# Patient Record
Sex: Female | Born: 1940 | Race: Black or African American | Hispanic: No | Smoking: Never smoker
Health system: Southern US, Community
[De-identification: ages and names within clinical notes are randomized; demographics above are authoritative.]

## PROBLEM LIST (undated history)

## (undated) DIAGNOSIS — I10 Essential (primary) hypertension: Secondary | ICD-10-CM

---

## 2015-04-23 DIAGNOSIS — I10 Essential (primary) hypertension: Secondary | ICD-10-CM | POA: Insufficient documentation

## 2015-06-25 DIAGNOSIS — Z559 Problems related to education and literacy, unspecified: Secondary | ICD-10-CM | POA: Insufficient documentation

## 2015-06-25 DIAGNOSIS — Z91148 Patient's other noncompliance with medication regimen for other reason: Secondary | ICD-10-CM | POA: Insufficient documentation

## 2015-06-25 DIAGNOSIS — E559 Vitamin D deficiency, unspecified: Secondary | ICD-10-CM | POA: Insufficient documentation

## 2019-09-09 DIAGNOSIS — E66813 Obesity, class 3: Secondary | ICD-10-CM | POA: Insufficient documentation

## 2019-09-09 DIAGNOSIS — R4189 Other symptoms and signs involving cognitive functions and awareness: Secondary | ICD-10-CM | POA: Insufficient documentation

## 2019-09-09 DIAGNOSIS — Z8616 Personal history of COVID-19: Secondary | ICD-10-CM | POA: Insufficient documentation

## 2019-09-11 DIAGNOSIS — R3129 Other microscopic hematuria: Secondary | ICD-10-CM | POA: Insufficient documentation

## 2019-09-23 DIAGNOSIS — E119 Type 2 diabetes mellitus without complications: Secondary | ICD-10-CM | POA: Insufficient documentation

## 2020-01-02 DIAGNOSIS — E782 Mixed hyperlipidemia: Secondary | ICD-10-CM | POA: Insufficient documentation

## 2020-02-23 DIAGNOSIS — N1831 Chronic kidney disease, stage 3a: Secondary | ICD-10-CM | POA: Insufficient documentation

## 2021-02-26 DIAGNOSIS — H268 Other specified cataract: Secondary | ICD-10-CM | POA: Insufficient documentation

## 2021-03-22 DIAGNOSIS — Z170 Estrogen receptor positive status [ER+]: Secondary | ICD-10-CM | POA: Insufficient documentation

## 2021-03-25 ENCOUNTER — Other Ambulatory Visit: Payer: Self-pay | Admitting: Surgery

## 2021-03-25 DIAGNOSIS — R928 Other abnormal and inconclusive findings on diagnostic imaging of breast: Secondary | ICD-10-CM

## 2021-04-01 ENCOUNTER — Ambulatory Visit
Admission: RE | Admit: 2021-04-01 | Discharge: 2021-04-01 | Disposition: A | Discharge status: Home / Self Care | Payer: Medicare HMO | Source: Ambulatory Visit | Attending: Surgery | Admitting: Surgery

## 2021-04-01 ENCOUNTER — Ambulatory Visit: Payer: Self-pay

## 2021-04-01 ENCOUNTER — Other Ambulatory Visit: Payer: Self-pay | Admitting: Surgery

## 2021-04-01 ENCOUNTER — Other Ambulatory Visit: Payer: Self-pay

## 2021-04-01 DIAGNOSIS — R928 Other abnormal and inconclusive findings on diagnostic imaging of breast: Secondary | ICD-10-CM

## 2021-04-01 NOTE — Progress Notes (Signed)
Patient ID: Stephanie Rivas, female   DOB: 1940-08-24, 81 y.o.   MRN: [id number] ? ?Patient presented for MRI biopsy of 2 right breast lesions. After initial images were obtained, patient had extravasation of right antecubital IV.  Estimate 15-20 cc in the right AC fossa.  Ice applied. ?Attempted IV in the left AC and hand, but unsuccessful.  ?Patient was given instructions to keep ice on the IV site until tonight and to expect some bruising and possible pain. ?Patient will be rescheduled for MRI biopsies. ? ?

## 2021-04-04 ENCOUNTER — Ambulatory Visit
Admission: RE | Admit: 2021-04-04 | Discharge: 2021-04-04 | Disposition: A | Discharge status: Home / Self Care | Payer: Medicare HMO | Source: Ambulatory Visit | Attending: Surgery | Admitting: Surgery

## 2021-04-04 ENCOUNTER — Ambulatory Visit: Admission: RE | Admit: 2021-04-04 | Payer: Self-pay | Source: Ambulatory Visit

## 2021-04-04 ENCOUNTER — Other Ambulatory Visit: Payer: Self-pay

## 2021-04-04 IMAGING — MR MR BREAST BX W LOC DEV 1ST LESION IMAGE BX SPEC MR GUIDE*L*
5 of 6 series · 31 of 48 positions shown · IV contrast (10 ml gadavist)
Comparison: Previous exams.
COMPARISON: Previous exams.

Addendum:
CLINICAL DATA: 80-year-old female presenting for MRI guided biopsy
of 2 sites in the left breast. The patient has known invasive
lobular cancer in the left breast.

EXAM:
MRI GUIDED CORE NEEDLE BIOPSY OF THE LEFT BREAST
TECHNIQUE: Multiplanar, multisequence MR imaging of the left breast was
performed both before and after administration of intravenous
contrast.
CONTRAST:  10 mL Gadavist

[Series 2: fiducial unilateral · sagittal · 2.0mm · 1.33mm/px · 3 of 52 slices shown]
[im 1/52]
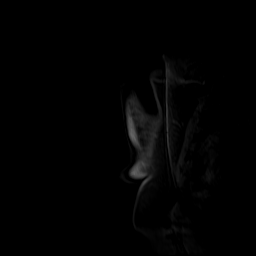
[im 26/52]
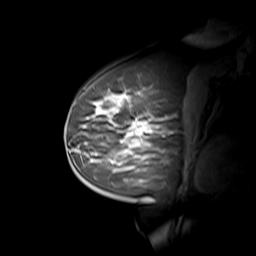
[im 52/52]
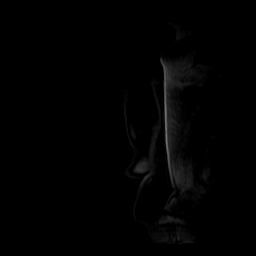

[Series 3: dynamic pre · axial · non-contrast · 1.3mm · 0.73mm/px · z∈[-75,+111]mm · 9 of 144 slices shown]
[im 1/144]
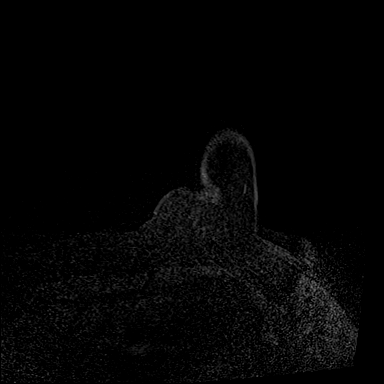
[im 18/144]
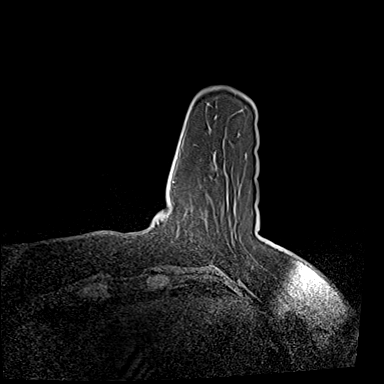
[im 36/144]
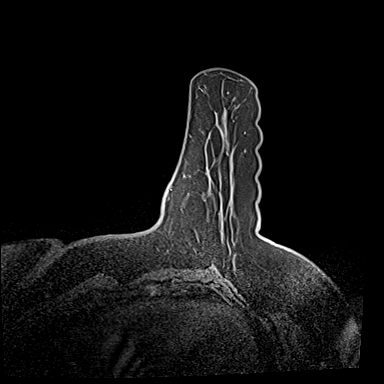
[im 54/144]
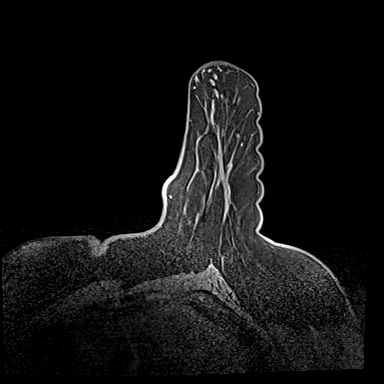
[im 72/144]
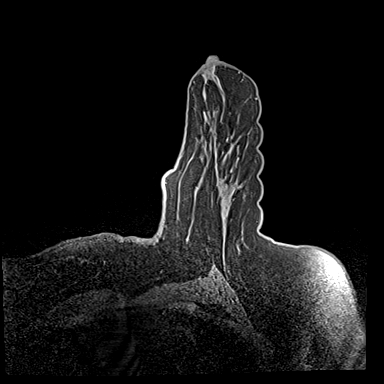
[im 90/144]
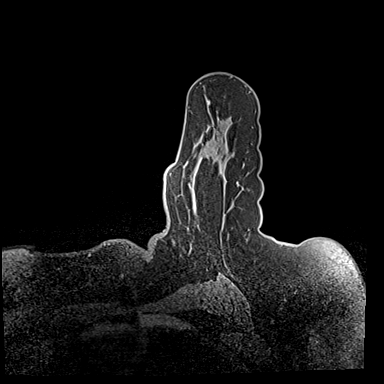
[im 108/144]
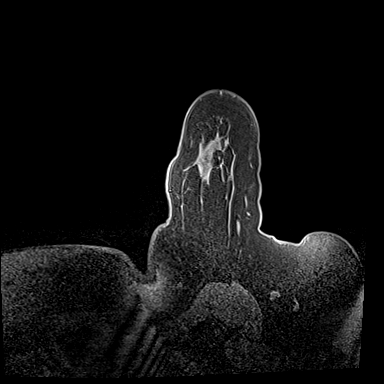
[im 126/144]
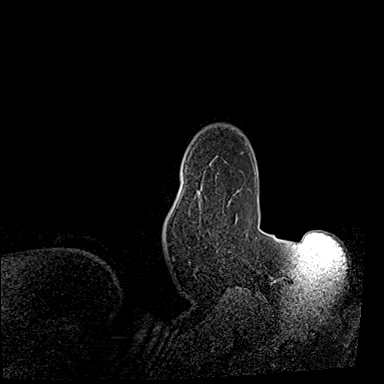
[im 144/144]
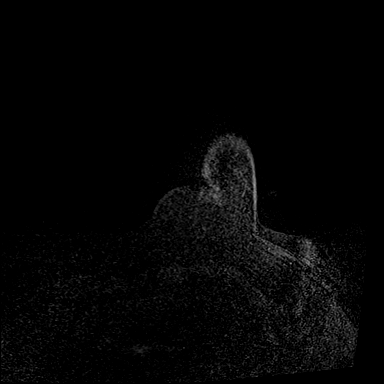

[Series 4: dynamic post 20 · axial · 1.3mm · 0.73mm/px · z∈[-75,+111]mm · 9 of 144 slices shown (1 of 2)]
[im 1/144]
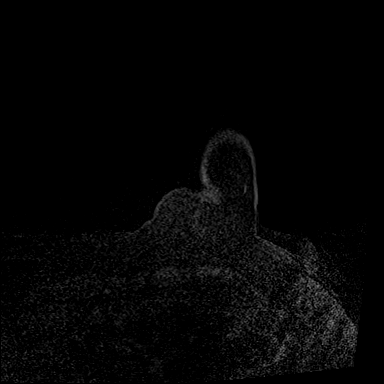
[im 18/144]
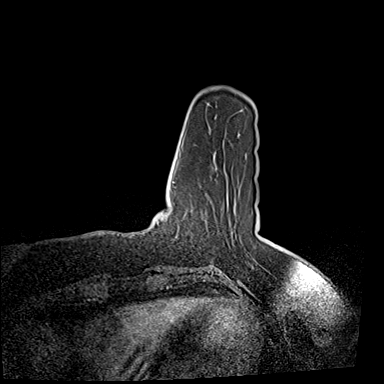
[im 36/144]
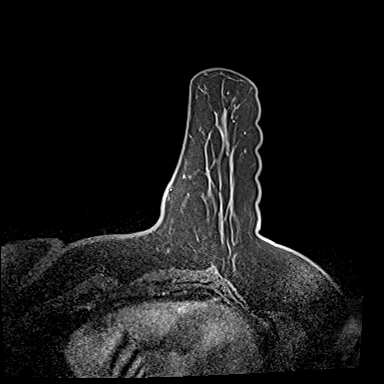
[im 54/144]
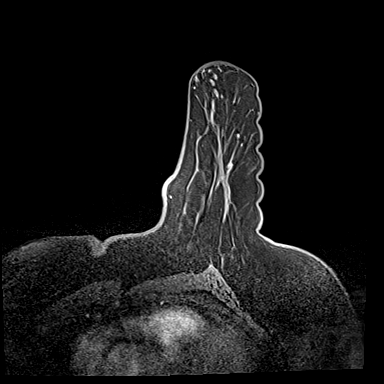
[im 72/144]
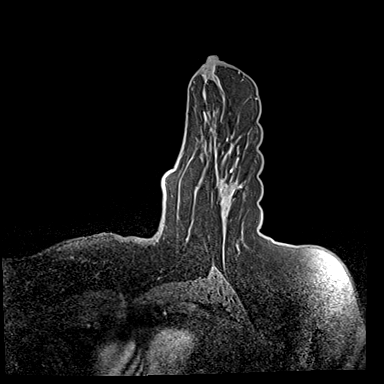
[im 90/144]
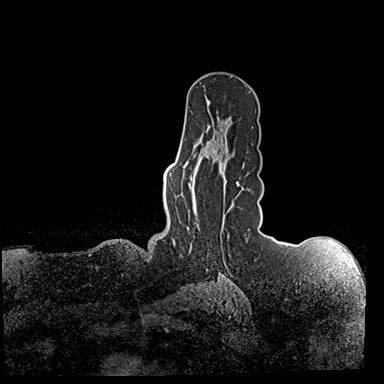
[im 108/144]
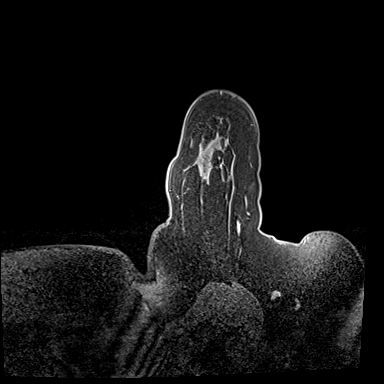
[im 126/144]
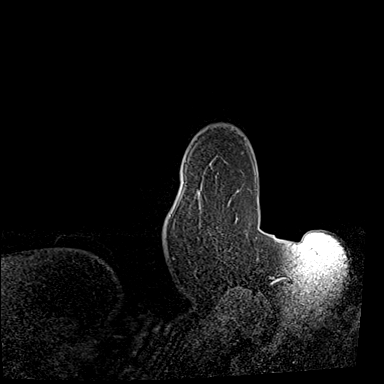
[im 144/144]
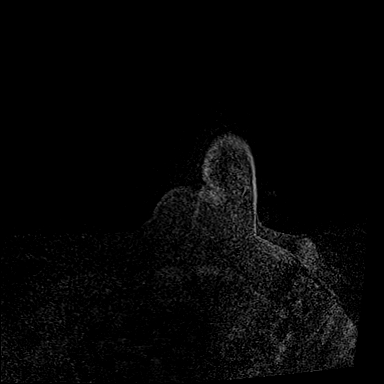

[Series 5: dynamic post 20 · axial · 1.3mm · 0.73mm/px · z∈[-75,+111]mm · 9 of 144 slices shown (2 of 2)]
[im 1/144]
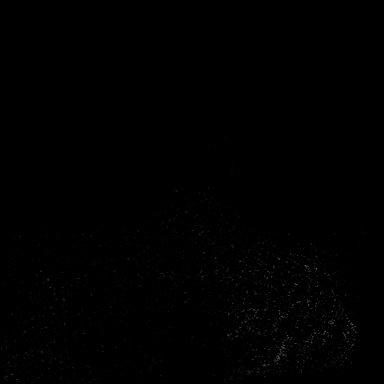
[im 18/144]
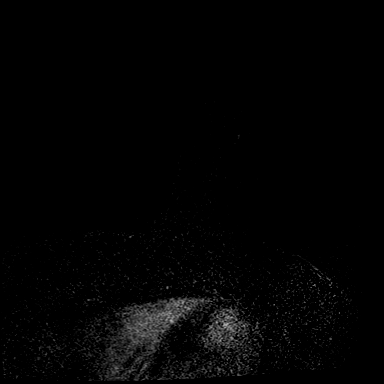
[im 36/144]
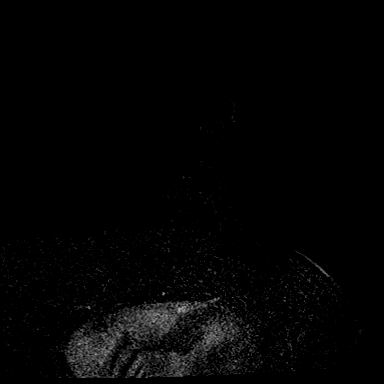
[im 54/144]
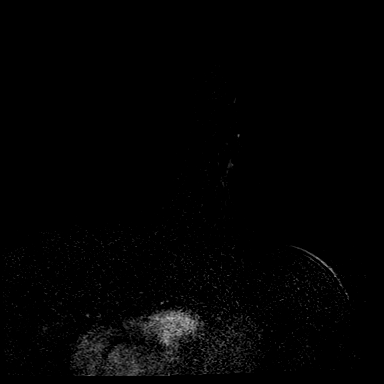
[im 72/144]
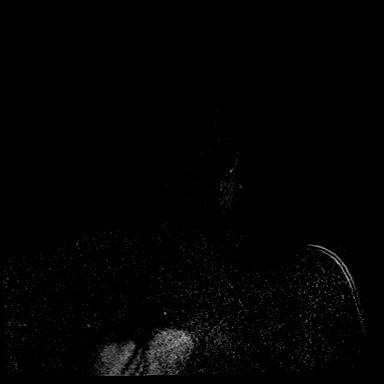
[im 90/144]
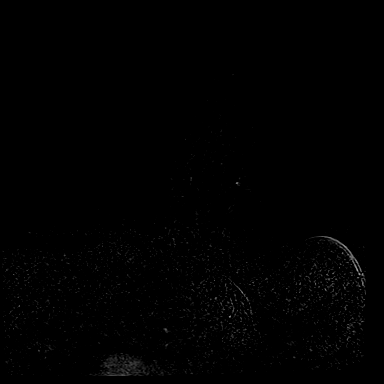
[im 108/144]
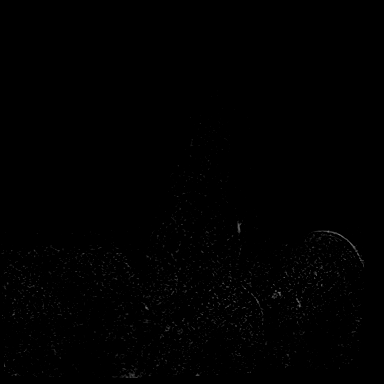
[im 126/144]
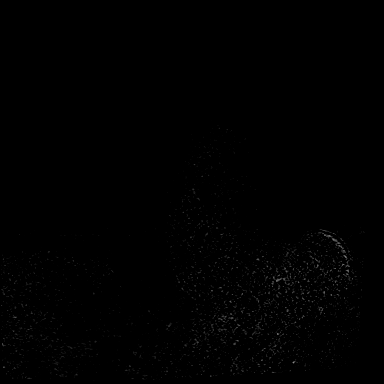
[im 144/144]
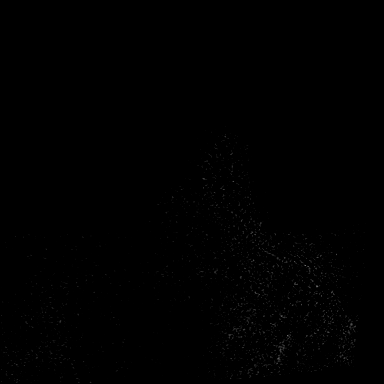

[Series 6: dynamic post 3 · axial · 1.3mm · 0.73mm/px · 1 of 144 slices shown]
[im 1/144]
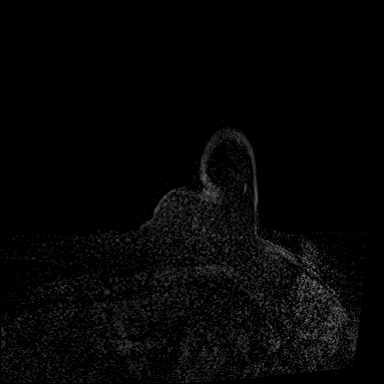

[31 of 48 positions shown; findings below may reference images not displayed]

FINDINGS: I met with the patient, and we discussed the procedure of MRI guided
biopsy, including risks, benefits, and alternatives. Specifically,
we discussed the risks of infection, bleeding, tissue injury, clip
migration, and inadequate sampling. Informed, written consent was
given. The usual time out protocol was performed immediately prior
to the procedure.

Using sterile technique, 1% Lidocaine, MRI guidance, and a 9 gauge
vacuum assisted device, biopsy was performed of non mass enhancement
in the lateral posterior left breast using a lateral approach. At
the conclusion of the procedure, a cylinder shaped tissue marker
clip was deployed into the biopsy cavity.

Using sterile technique, 1% Lidocaine, MRI guidance, and a 9 gauge
vacuum assisted device, biopsy was performed of non mass enhancement
in the central anterior left breast using a lateral approach. At the
conclusion of the procedure, a dumbbell shaped tissue marker clip
was deployed into the biopsy cavity.

Follow-up 2-view mammogram was performed and dictated separately.
IMPRESSION: 1. MRI guided biopsy of non mass enhancement in the lateral
posterior left breast. No apparent complications.

2. MRI guided biopsy of non mass enhancement in the central anterior
left breast. No apparent complications.

ADDENDUM:
Pathology revealed GRADE [DATE] INVASIVE MAMMARY CARCINOMA, LOW TO
INTERMEDIATE GRADE MAMMARY CARCINOMA IN SITU of the LEFT breast,
lateral posterior (cylinder clip). This was found to be concordant
by Dr. VAHEED.

Pathology revealed LOW TO INTERMEDIATE GRADE MAMMARY CARCINOMA IN
SITU of the LEFT breast, central anterior (VAHEED clip). This was
found to be concordant by Dr. VAHEED.

Pathology results were discussed with the patient by telephone. The
patient reported doing well after the biopsies with tenderness at
the sites. Post biopsy instructions and care were reviewed and
questions were answered. The patient was encouraged to call The

Surgical consultation has been arranged with Dr. VAHEED
VAHEED at [REDACTED] [REDACTED] VAHEED on

Note: The 2 RIGHT breast abnormalities identified on [DATE] MR
are not definitely identified on [DATE] study and MR guided
biopsies were not performed. This most likely represents a benign
process but six-month follow-up MR is recommended to ensure
stability.

Pathology results reported by VAHEED RN on [DATE].

*** End of Addendum ***
FINDINGS: I met with the patient, and we discussed the procedure of MRI guided
biopsy, including risks, benefits, and alternatives. Specifically,
we discussed the risks of infection, bleeding, tissue injury, clip
migration, and inadequate sampling. Informed, written consent was
given. The usual time out protocol was performed immediately prior
to the procedure.

Using sterile technique, 1% Lidocaine, MRI guidance, and a 9 gauge
vacuum assisted device, biopsy was performed of non mass enhancement
in the lateral posterior left breast using a lateral approach. At
the conclusion of the procedure, a cylinder shaped tissue marker
clip was deployed into the biopsy cavity.

Using sterile technique, 1% Lidocaine, MRI guidance, and a 9 gauge
vacuum assisted device, biopsy was performed of non mass enhancement
in the central anterior left breast using a lateral approach. At the
conclusion of the procedure, a dumbbell shaped tissue marker clip
was deployed into the biopsy cavity.

Follow-up 2-view mammogram was performed and dictated separately.
IMPRESSION: 1. MRI guided biopsy of non mass enhancement in the lateral
posterior left breast. No apparent complications.

2. MRI guided biopsy of non mass enhancement in the central anterior
left breast. No apparent complications.

## 2021-04-08 ENCOUNTER — Other Ambulatory Visit: Payer: Self-pay | Admitting: Surgery

## 2021-04-08 ENCOUNTER — Other Ambulatory Visit: Payer: Self-pay

## 2021-04-08 ENCOUNTER — Ambulatory Visit
Admission: RE | Admit: 2021-04-08 | Discharge: 2021-04-08 | Disposition: A | Discharge status: Home / Self Care | Payer: Medicare HMO | Source: Ambulatory Visit | Attending: Surgery | Admitting: Surgery

## 2021-04-08 ENCOUNTER — Ambulatory Visit: Admission: RE | Admit: 2021-04-08 | Payer: Self-pay | Source: Ambulatory Visit

## 2021-04-08 DIAGNOSIS — R928 Other abnormal and inconclusive findings on diagnostic imaging of breast: Secondary | ICD-10-CM

## 2021-04-08 IMAGING — MR MR BREAST*R* WO/W CM
6 of 8 series · 34 of 48 positions shown · IV contrast (gadavist)
Comparison: Prior studies

CLINICAL DATA: 80-year-old female presents for MR guided biopsies
of 2 separate RIGHT breast abnormalities. Recent diagnosis of LEFT
breast invasive lobular carcinoma.

EXAM:
MR OF THE RIGHT BREAST WITH AND WITHOUT CONTRAST
TECHNIQUE: Multiplanar, multisequence MR images of the right breast were
obtained prior to and following the intravenous administration of 10
ml of Gadavist.

[Series 2: fiducial unilateral · sagittal · 2.0mm · 1.33mm/px · 1 of 52 slices shown]
[im 1/52]
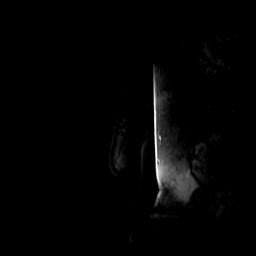

[Series 3: dynamic pre · axial · non-contrast · 1.3mm · 0.73mm/px · z∈[-67,+119]mm · 6 of 144 slices shown]
[im 1/144]
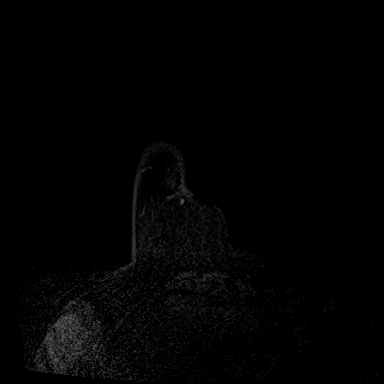
[im 29/144]
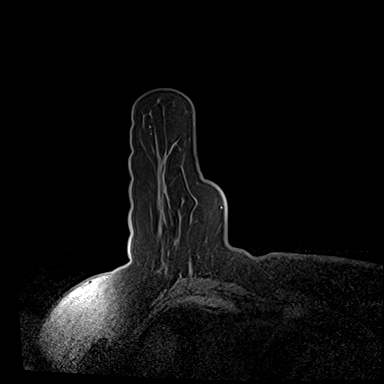
[im 58/144]
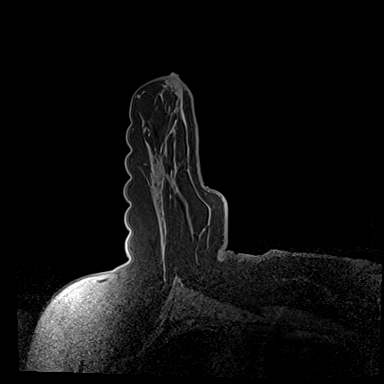
[im 86/144]
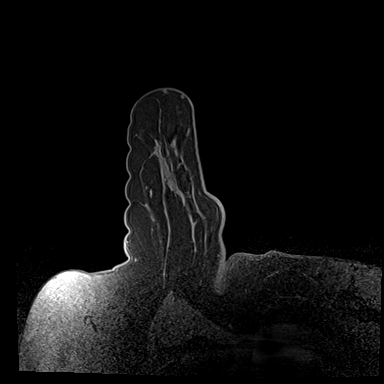
[im 115/144]
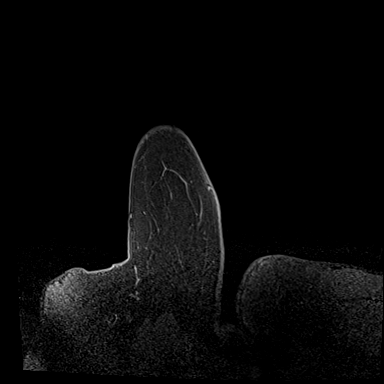
[im 144/144]
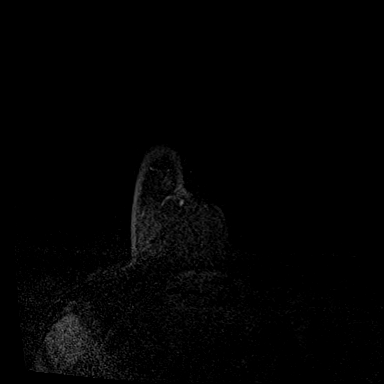

[Series 4: dynamic post 20 · axial · 1.3mm · 0.73mm/px · z∈[-67,+119]mm · 6 of 144 slices shown (1 of 2)]
[im 1/144]
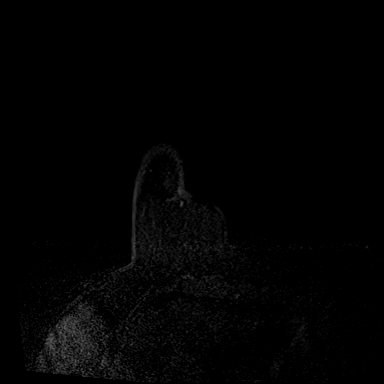
[im 29/144]
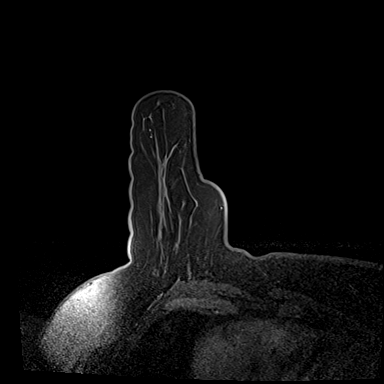
[im 58/144]
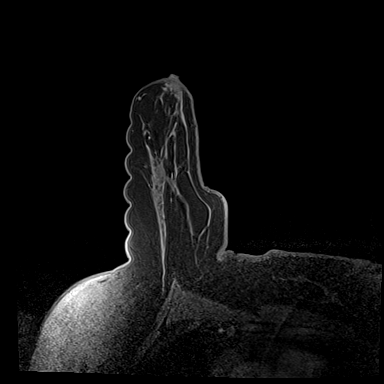
[im 86/144]
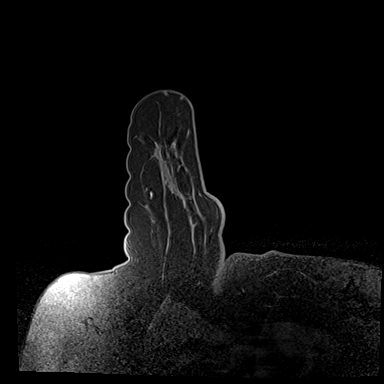
[im 115/144]
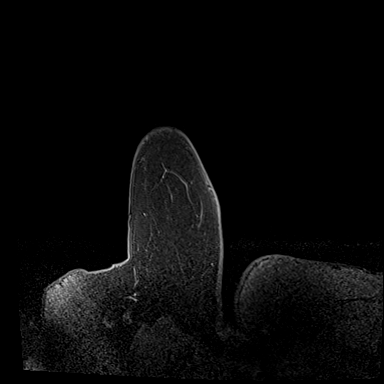
[im 144/144]
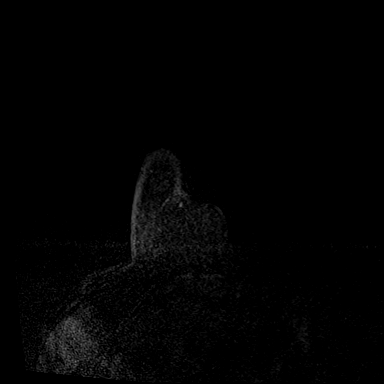

[Series 5: dynamic post 20 · axial · 1.3mm · 0.73mm/px · z∈[-67,+119]mm · 7 of 144 slices shown (2 of 2)]
[im 1/144]
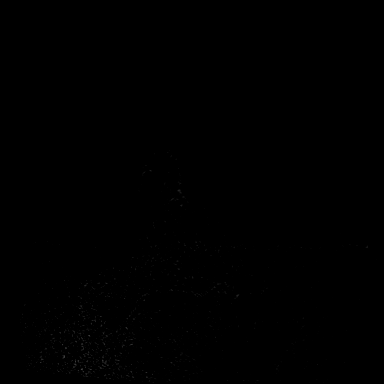
[im 24/144]
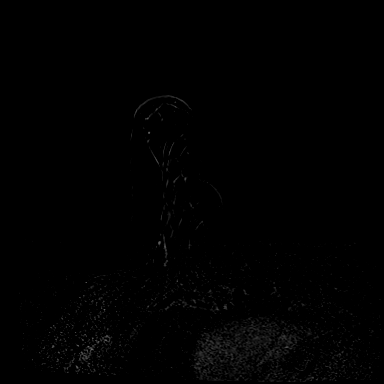
[im 48/144]
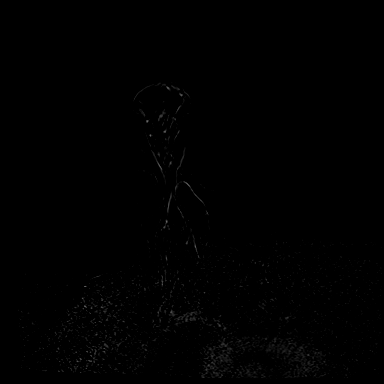
[im 72/144]
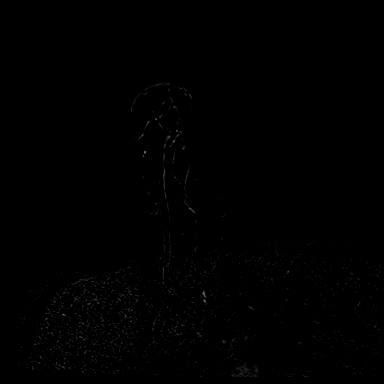
[im 96/144]
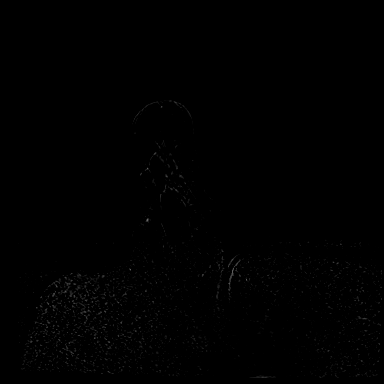
[im 120/144]
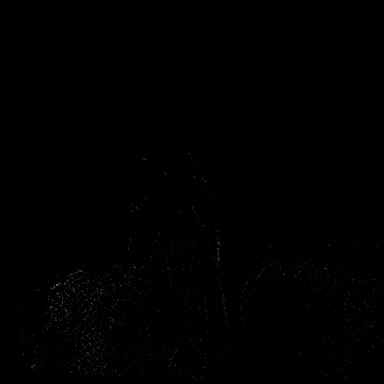
[im 144/144]
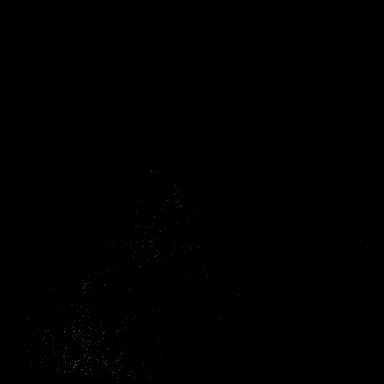

[Series 6: dynamic post 3 · axial · 1.3mm · 0.73mm/px · z∈[-67,+119]mm · 7 of 144 slices shown (1 of 2)]
[im 1/144]
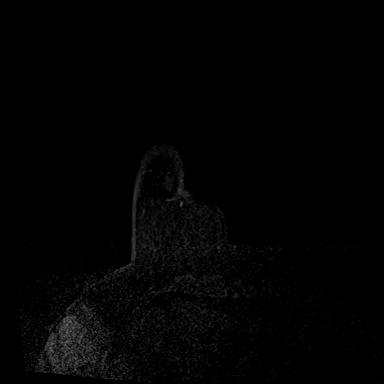
[im 24/144]
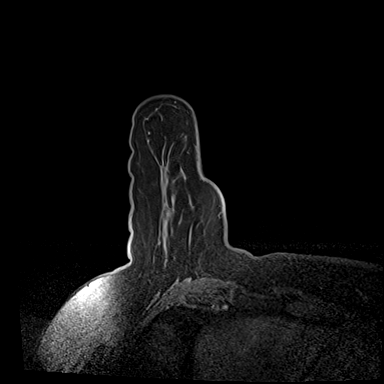
[im 48/144]
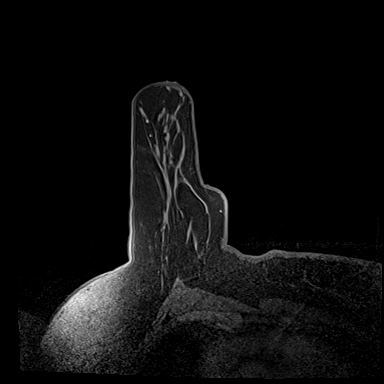
[im 72/144]
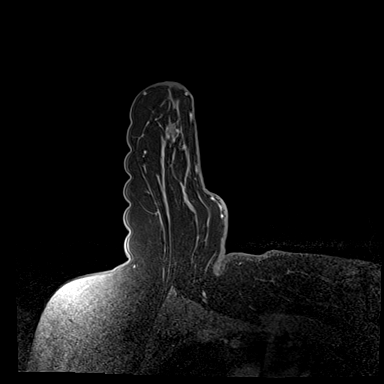
[im 96/144]
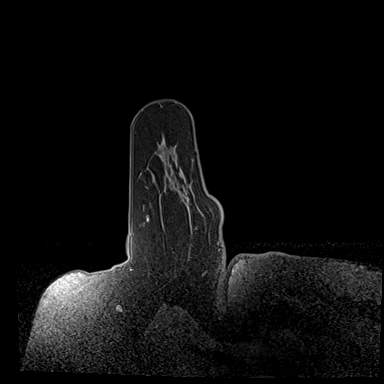
[im 120/144]
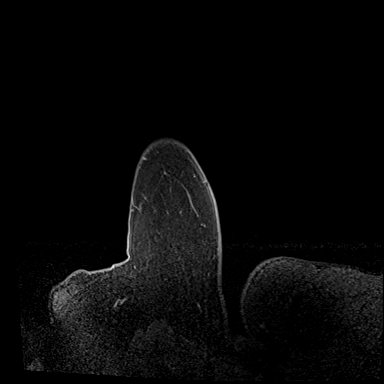
[im 144/144]
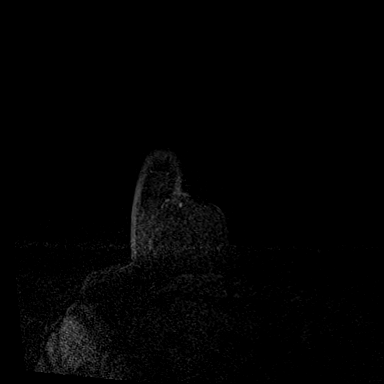

[Series 7: dynamic post 3 · axial · 1.3mm · 0.73mm/px · z∈[-67,+119]mm · 7 of 144 slices shown (2 of 2)]
[im 1/144]
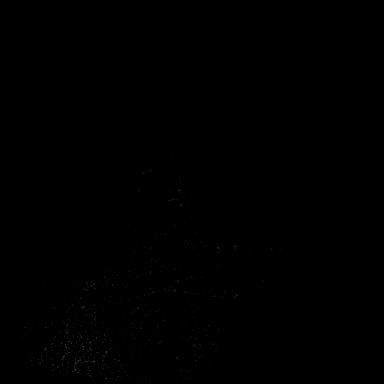
[im 24/144]
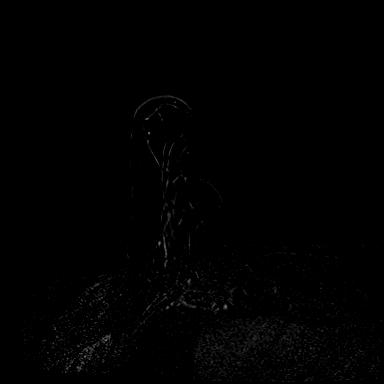
[im 48/144]
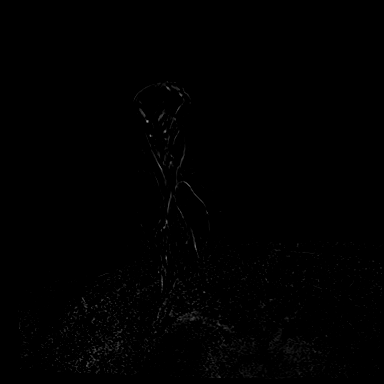
[im 72/144]
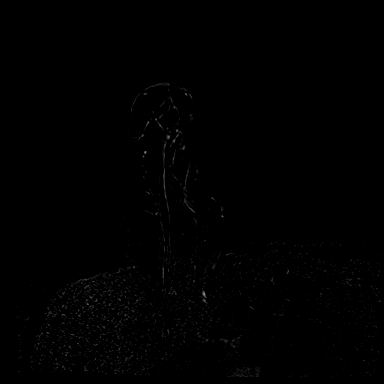
[im 96/144]
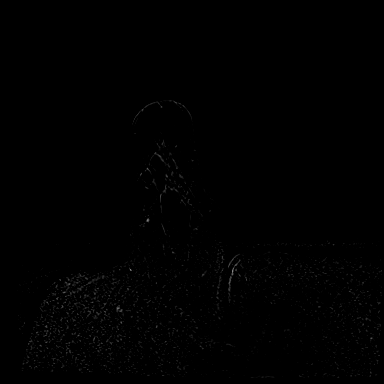
[im 120/144]
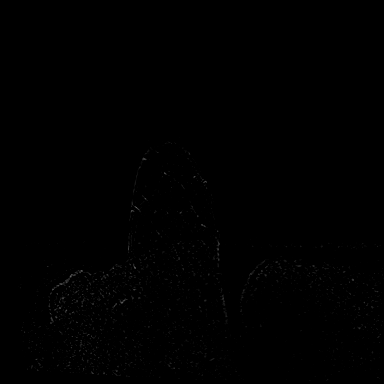
[im 144/144]
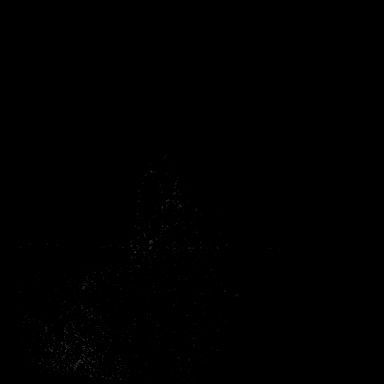

[34 of 48 positions shown; findings below may reference images not displayed]

Three-dimensional MR images were rendered by post-processing of the
original MR data on an independent workstation. The
three-dimensional MR images were interpreted, and findings are
reported in the following complete MRI report for this study. Three
dimensional images were evaluated at the independent [employer]
workstation
FINDINGS: Breast composition: b. Scattered fibroglandular tissue.

Background parenchymal enhancement: Mild

Right breast: The mass in the UPPER-OUTER RIGHT breast and the non
masslike enhancement in the UPPER RIGHT breast are not well
visualized on today's study. No suspicious findings are noted.

Lymph nodes: No abnormal appearing lymph nodes.

Ancillary findings:  None.
IMPRESSION: 1. The 2 RIGHT breast abnormalities identified on [DATE] MR are
not definitely identified on today study and MR guided biopsies were
not performed. This most likely represents a benign process but
six-month follow-up is recommended to ensure stability.

RECOMMENDATION:
Breast MRI in 6 months.

The patient is scheduled for MR guided LEFT breast biopsies later
this week.

BI-RADS CATEGORY  3: Probably benign.

## 2021-04-08 MED ORDER — GADOBUTROL 1 MMOL/ML IV SOLN: 10.0000 mL | Freq: Once | INTRAVENOUS | Status: DC | PRN

## 2021-04-12 ENCOUNTER — Ambulatory Visit: Admission: RE | Admit: 2021-04-12 | Payer: Medicare HMO | Source: Ambulatory Visit

## 2021-04-12 ENCOUNTER — Other Ambulatory Visit: Payer: Self-pay

## 2021-04-26 ENCOUNTER — Other Ambulatory Visit: Payer: Medicare HMO

## 2021-05-06 ENCOUNTER — Ambulatory Visit
Admission: RE | Admit: 2021-05-06 | Discharge: 2021-05-06 | Disposition: A | Discharge status: Home / Self Care | Payer: Medicare HMO | Source: Ambulatory Visit | Attending: Surgery | Admitting: Surgery

## 2021-05-06 DIAGNOSIS — R928 Other abnormal and inconclusive findings on diagnostic imaging of breast: Secondary | ICD-10-CM

## 2021-05-06 IMAGING — MG MM BREAST LOCALIZATION CLIP
4 series · 4 of 12 positions shown · non-contrast
Comparison: Previous exam(s).

CLINICAL DATA: Post biopsy mammogram of the left breast for clip
placement.

EXAM:
3D DIAGNOSTIC LEFT MAMMOGRAM POST MRI BIOPSY

[L ML synth-2D]
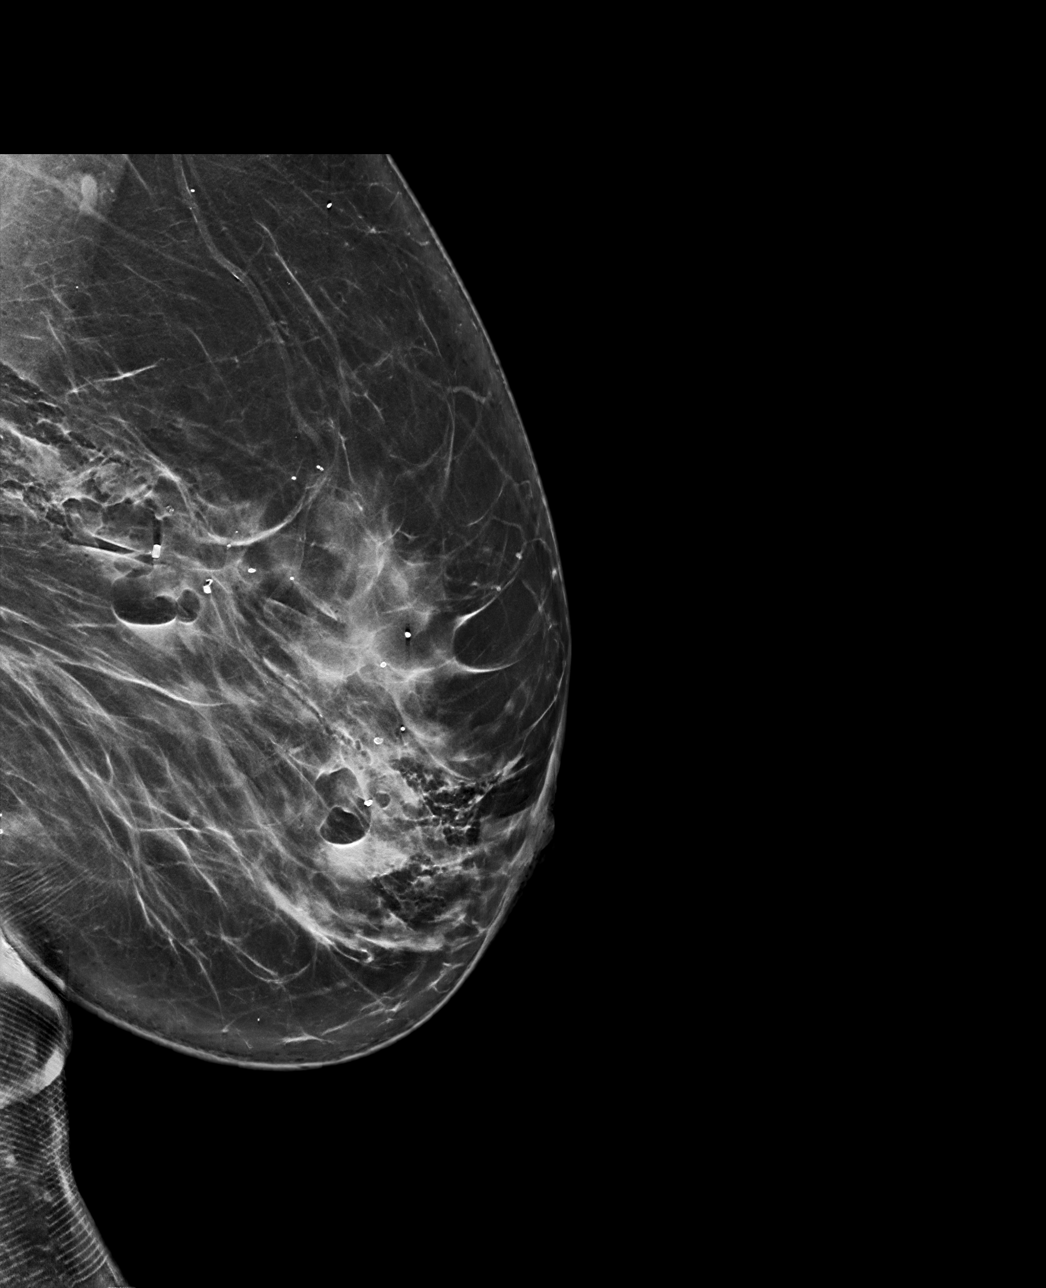

[L CC synth-2D]
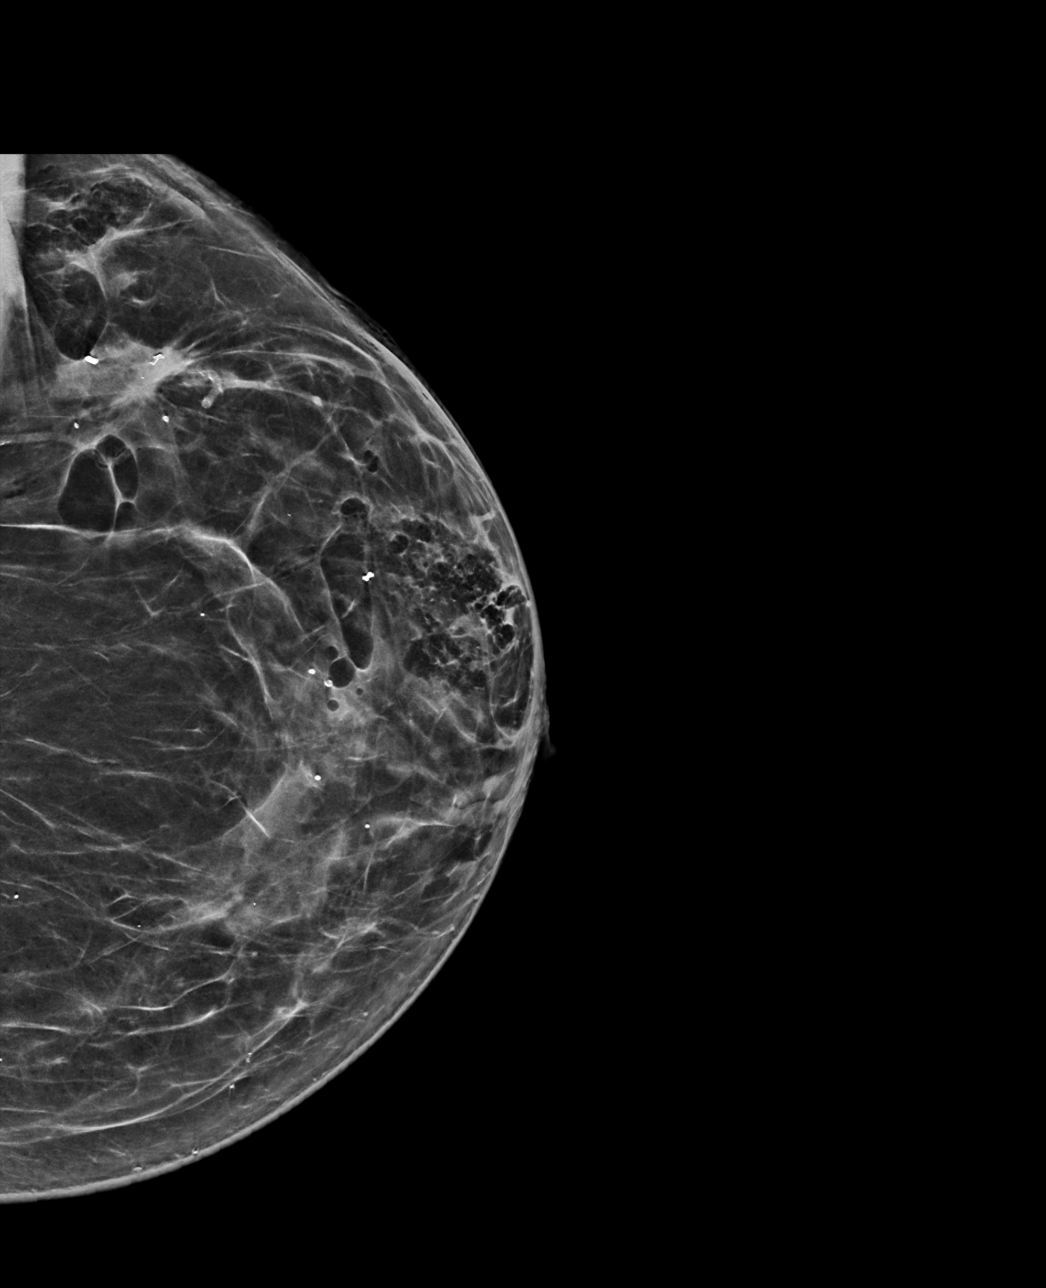

[L ML tomo · tomo slice 45/89.0]
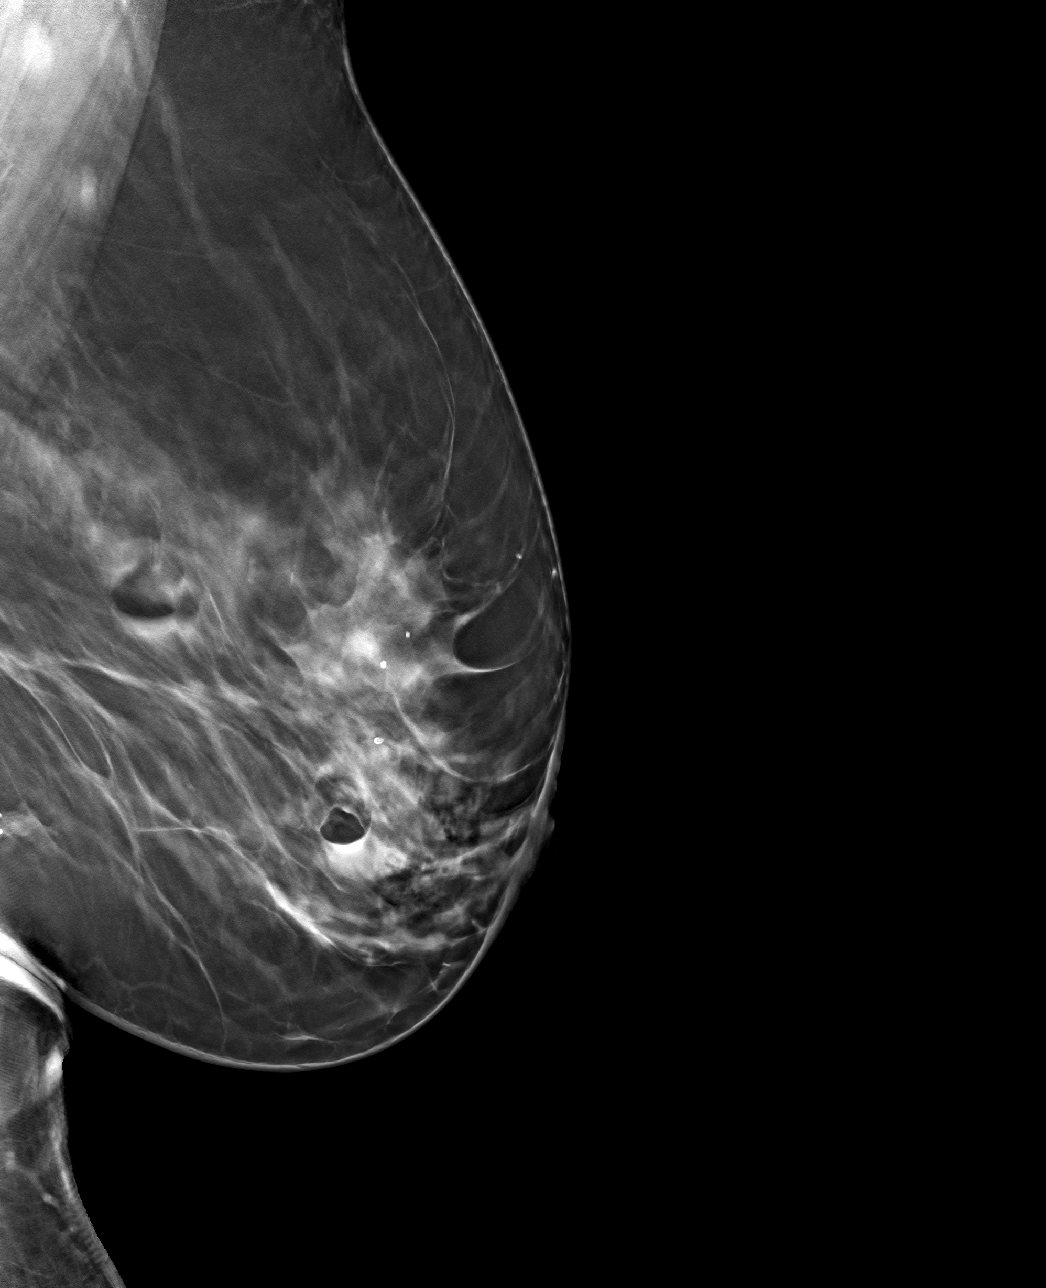

[L CC tomo · tomo slice 36/71.0]
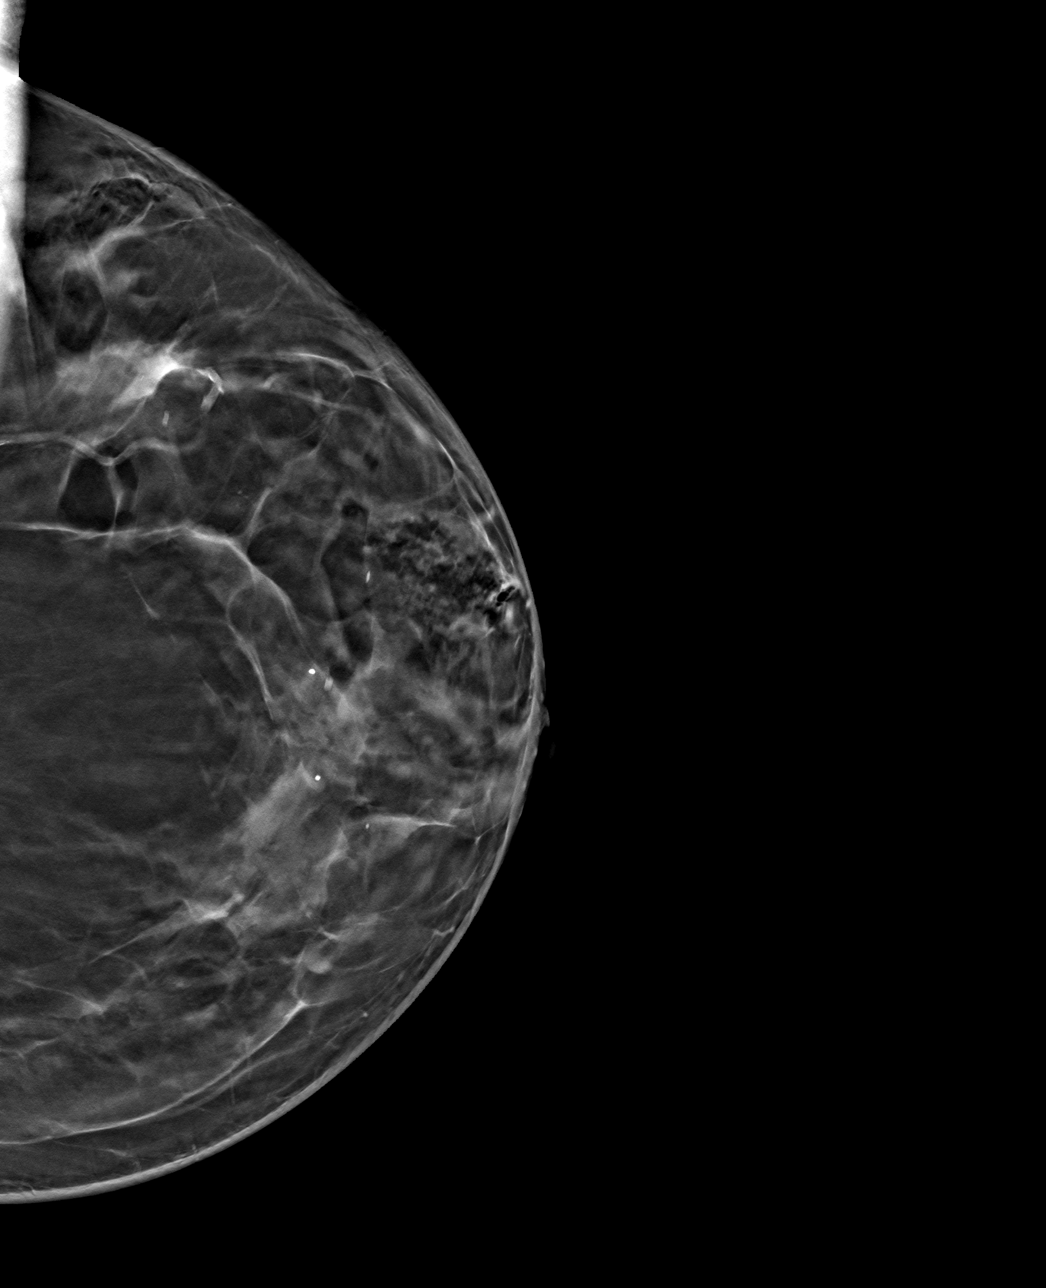

[4 of 12 positions shown; findings below may reference images not displayed]

FINDINGS: 3D Mammographic images were obtained following MRI guided biopsy of
the anterior and posterior margin of the non mass enhancement in the
left breast. The biopsy marking clips are in expected position at
the sites of biopsy. The cylinder shaped biopsy marking clip along
the posterior margin of the non mass enhancement is position
approximately 1.2 cm posterior from the coil shaped biopsy marking
clip marking the site of known malignancy. The coil shaped biopsy
marking clip in the lateral posterior left breast and the
dumbbell-shaped biopsy marking clip in the anterior central left
breast are 6.5 cm apart. This clip is 5.2 cm anterior to the coil
clip at the site of known cancer.
IMPRESSION: 1. Appropriate positioning of the cylinder shaped biopsy marking
clip at the site of biopsy in the lateral posterior left breast.
This clip is just 1.2 cm from the coil clip at the site of known
cancer.

2. Appropriate positioning of the dumbbell-shaped biopsy marking
clip in the anterior left breast. This clip is 6.5 cm from the
cylinder shaped biopsy marking clip and 5.2 cm anterior to the coil
clip in the known cancer.

Final Assessment: Post Procedure Mammograms for Marker Placement

## 2021-05-06 IMAGING — MR MR BREAST BX W LOC DEV EA ADD LESION IMAGE BX SPEC MR GUIDE*L*
7 of 10 series · 31 of 48 positions shown · IV contrast (10 ml gadavist)
Comparison: Previous exams.
COMPARISON: Previous exams.

Addendum:
CLINICAL DATA: 80-year-old female presenting for MRI guided biopsy
of 2 sites in the left breast. The patient has known invasive
lobular cancer in the left breast.

EXAM:
MRI GUIDED CORE NEEDLE BIOPSY OF THE LEFT BREAST
TECHNIQUE: Multiplanar, multisequence MR imaging of the left breast was
performed both before and after administration of intravenous
contrast.
CONTRAST:  10 mL Gadavist

[Series 2: fiducial unilateral · sagittal · 2.0mm · 1.33mm/px · 3 of 52 slices shown]
[im 1/52]
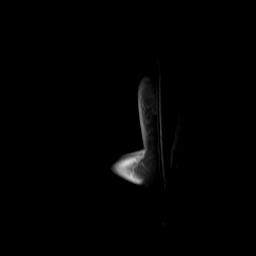
[im 26/52]
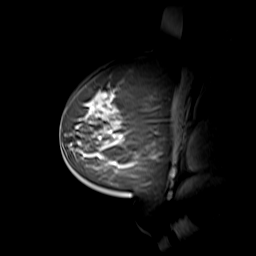
[im 52/52]
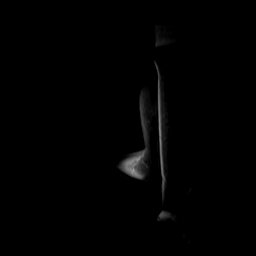

[Series 3: dynamic pre · axial · non-contrast · 1.3mm · 0.73mm/px · z∈[-81,+105]mm · 5 of 144 slices shown]
[im 1/144]
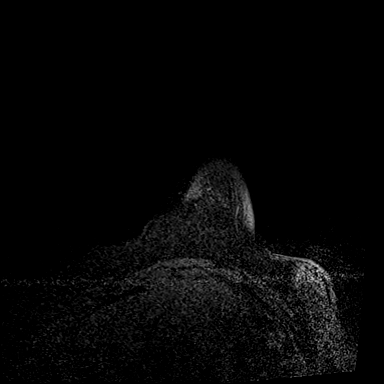
[im 36/144]
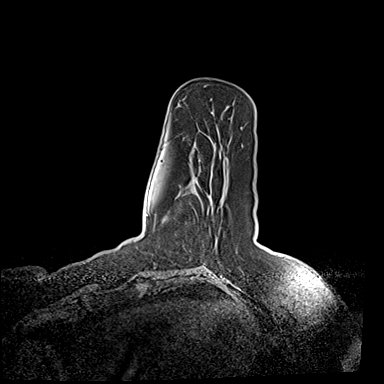
[im 72/144]
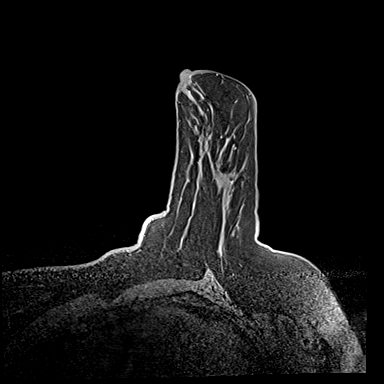
[im 108/144]
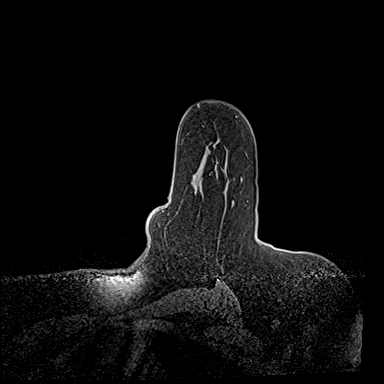
[im 144/144]
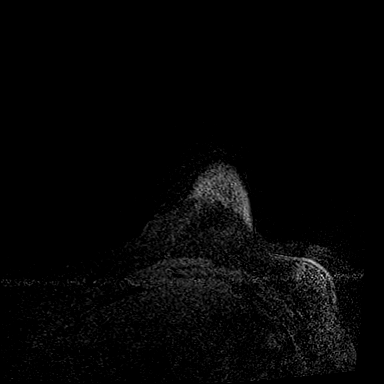

[Series 4: dynamic post 20 · axial · 1.3mm · 0.73mm/px · z∈[-81,+105]mm · 5 of 144 slices shown (1 of 2)]
[im 1/144]
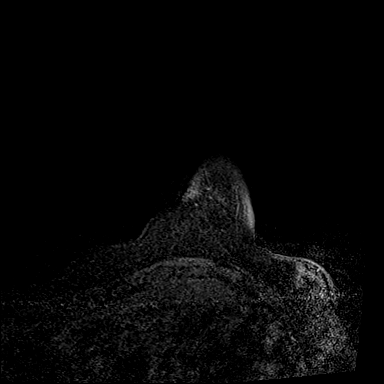
[im 36/144]
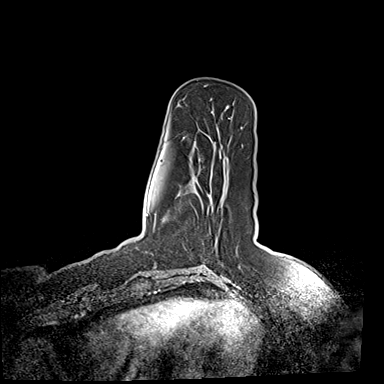
[im 72/144]
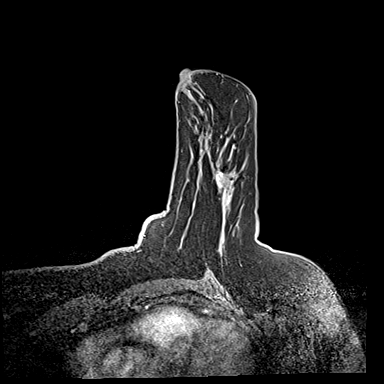
[im 108/144]
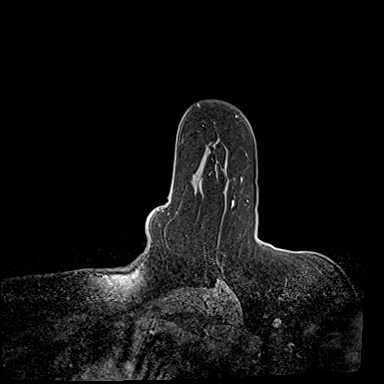
[im 144/144]
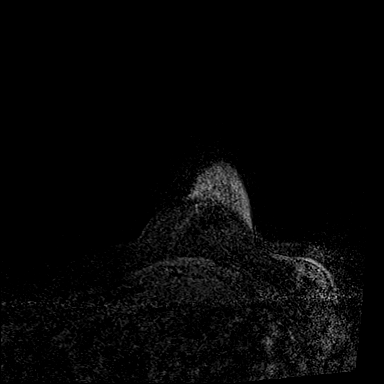

[Series 5: dynamic post 20 · axial · 1.3mm · 0.73mm/px · z∈[-81,+105]mm · 5 of 144 slices shown (2 of 2)]
[im 1/144]
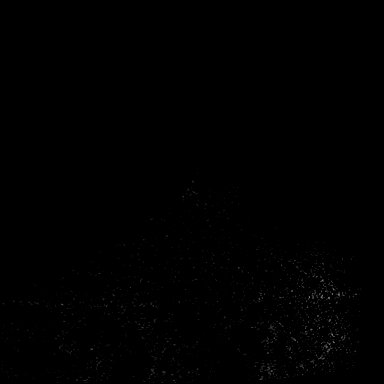
[im 36/144]
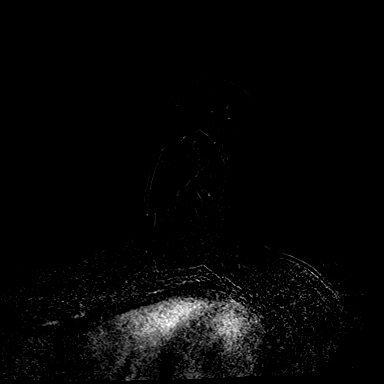
[im 72/144]
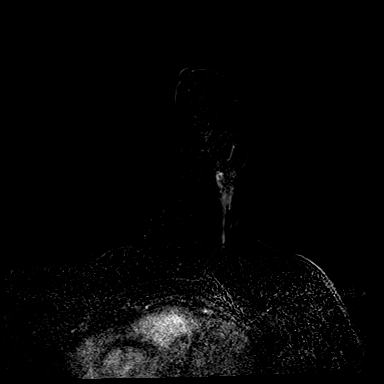
[im 108/144]
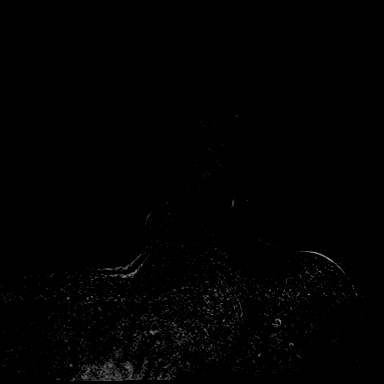
[im 144/144]
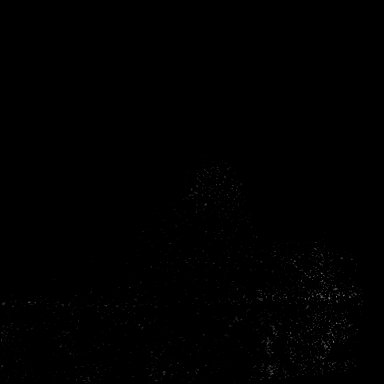

[Series 6: dynamic post 3 · axial · 1.3mm · 0.73mm/px · z∈[-81,+105]mm · 5 of 144 slices shown (1 of 2)]
[im 1/144]
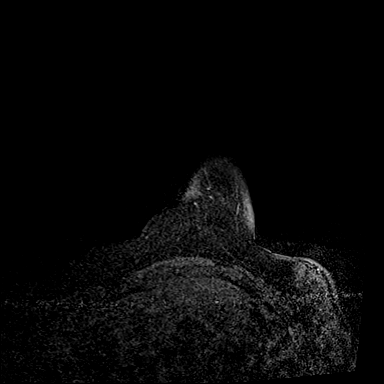
[im 36/144]
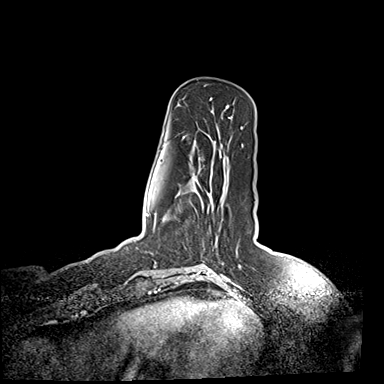
[im 72/144]
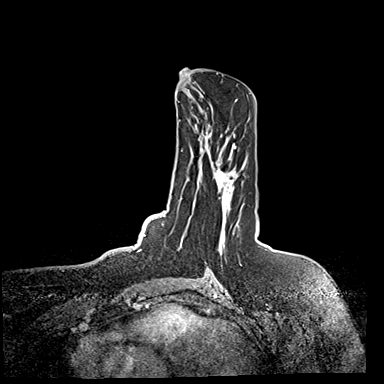
[im 108/144]
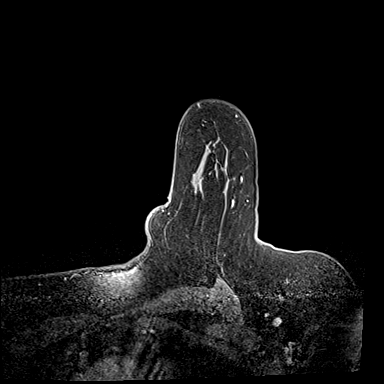
[im 144/144]
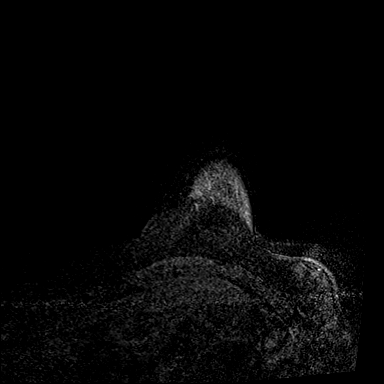

[Series 7: dynamic post 3 · axial · 1.3mm · 0.73mm/px · z∈[-81,+105]mm · 5 of 144 slices shown (2 of 2)]
[im 1/144]
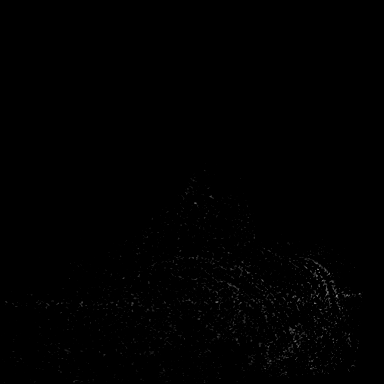
[im 36/144]
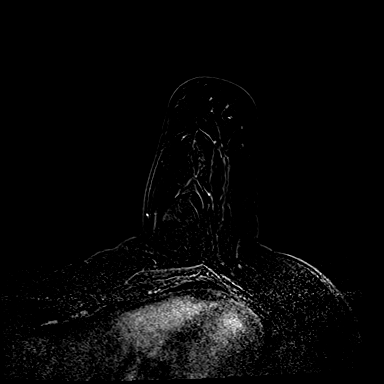
[im 72/144]
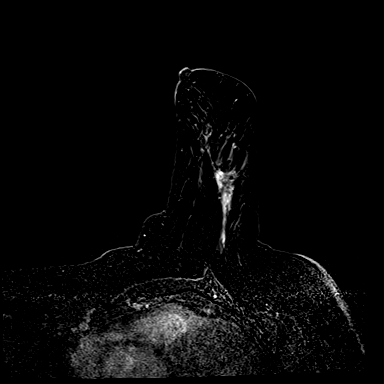
[im 108/144]
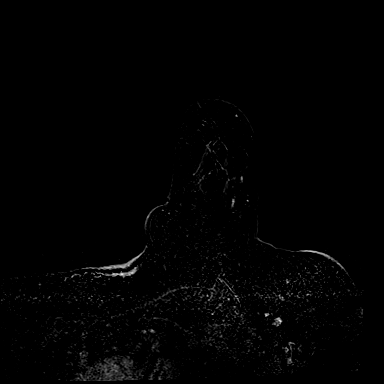
[im 144/144]
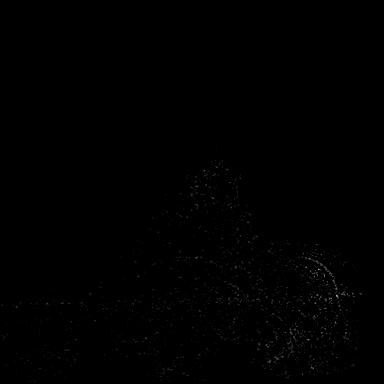

[Series 8: needle confirmation · axial · 1.3mm · 0.73mm/px · z∈[-81,+11]mm · 3 of 144 slices shown]
[im 1/144]
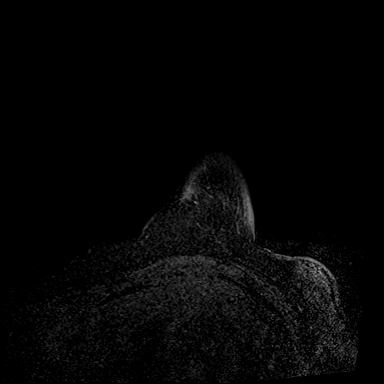
[im 36/144]
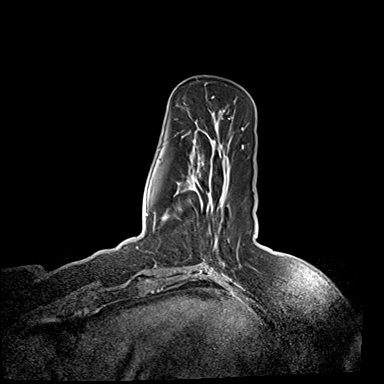
[im 72/144]
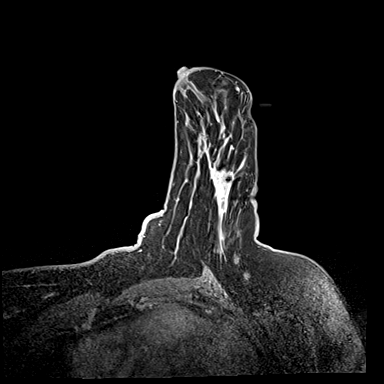

[31 of 48 positions shown; findings below may reference images not displayed]

FINDINGS: I met with the patient, and we discussed the procedure of MRI guided
biopsy, including risks, benefits, and alternatives. Specifically,
we discussed the risks of infection, bleeding, tissue injury, clip
migration, and inadequate sampling. Informed, written consent was
given. The usual time out protocol was performed immediately prior
to the procedure.

Using sterile technique, 1% Lidocaine, MRI guidance, and a 9 gauge
vacuum assisted device, biopsy was performed of non mass enhancement
in the lateral posterior left breast using a lateral approach. At
the conclusion of the procedure, a cylinder shaped tissue marker
clip was deployed into the biopsy cavity.

Using sterile technique, 1% Lidocaine, MRI guidance, and a 9 gauge
vacuum assisted device, biopsy was performed of non mass enhancement
in the central anterior left breast using a lateral approach. At the
conclusion of the procedure, a dumbbell shaped tissue marker clip
was deployed into the biopsy cavity.

Follow-up 2-view mammogram was performed and dictated separately.
IMPRESSION: 1. MRI guided biopsy of non mass enhancement in the lateral
posterior left breast. No apparent complications.

2. MRI guided biopsy of non mass enhancement in the central anterior
left breast. No apparent complications.

ADDENDUM:
Pathology revealed GRADE [DATE] INVASIVE MAMMARY CARCINOMA, LOW TO
INTERMEDIATE GRADE MAMMARY CARCINOMA IN SITU of the LEFT breast,
lateral posterior (cylinder clip). This was found to be concordant
by Dr. VAHEED.

Pathology revealed LOW TO INTERMEDIATE GRADE MAMMARY CARCINOMA IN
SITU of the LEFT breast, central anterior (VAHEED clip). This was
found to be concordant by Dr. VAHEED.

Pathology results were discussed with the patient by telephone. The
patient reported doing well after the biopsies with tenderness at
the sites. Post biopsy instructions and care were reviewed and
questions were answered. The patient was encouraged to call The

Surgical consultation has been arranged with Dr. VAHEED
VAHEED at [REDACTED] [REDACTED] VAHEED on

Note: The 2 RIGHT breast abnormalities identified on [DATE] MR
are not definitely identified on [DATE] study and MR guided
biopsies were not performed. This most likely represents a benign
process but six-month follow-up MR is recommended to ensure
stability.

Pathology results reported by VAHEED RN on [DATE].

*** End of Addendum ***
FINDINGS: I met with the patient, and we discussed the procedure of MRI guided
biopsy, including risks, benefits, and alternatives. Specifically,
we discussed the risks of infection, bleeding, tissue injury, clip
migration, and inadequate sampling. Informed, written consent was
given. The usual time out protocol was performed immediately prior
to the procedure.

Using sterile technique, 1% Lidocaine, MRI guidance, and a 9 gauge
vacuum assisted device, biopsy was performed of non mass enhancement
in the lateral posterior left breast using a lateral approach. At
the conclusion of the procedure, a cylinder shaped tissue marker
clip was deployed into the biopsy cavity.

Using sterile technique, 1% Lidocaine, MRI guidance, and a 9 gauge
vacuum assisted device, biopsy was performed of non mass enhancement
in the central anterior left breast using a lateral approach. At the
conclusion of the procedure, a dumbbell shaped tissue marker clip
was deployed into the biopsy cavity.

Follow-up 2-view mammogram was performed and dictated separately.
IMPRESSION: 1. MRI guided biopsy of non mass enhancement in the lateral
posterior left breast. No apparent complications.

2. MRI guided biopsy of non mass enhancement in the central anterior
left breast. No apparent complications.

## 2021-05-10 DIAGNOSIS — M545 Low back pain, unspecified: Secondary | ICD-10-CM | POA: Insufficient documentation

## 2021-08-15 ENCOUNTER — Encounter (HOSPITAL_COMMUNITY): Payer: Self-pay

## 2021-08-15 ENCOUNTER — Ambulatory Visit (HOSPITAL_COMMUNITY)
Admission: EM | Admit: 2021-08-15 | Discharge: 2021-08-15 | Disposition: A | Discharge status: Home / Self Care | Payer: Medicare HMO

## 2021-08-15 DIAGNOSIS — I10 Essential (primary) hypertension: Secondary | ICD-10-CM | POA: Diagnosis not present

## 2021-08-15 DIAGNOSIS — Z760 Encounter for issue of repeat prescription: Secondary | ICD-10-CM | POA: Diagnosis not present

## 2021-08-15 HISTORY — DX: Essential (primary) hypertension: I10

## 2021-08-15 MED ORDER — AMLODIPINE BESYLATE 10 MG PO TABS: 10.0000 mg | ORAL_TABLET | Freq: Every day | ORAL | 0 refills | Status: DC
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
## 2021-08-15 NOTE — Discharge Instructions (Addendum)
Take the amlodipine once daily. Please follow up with your new primary care provider regarding your blood pressure management.

## 2021-08-30 ENCOUNTER — Encounter: Payer: Medicare HMO | Admitting: Family

## 2021-08-30 NOTE — Progress Notes (Signed)
  This encounter was created in error - please disregard. No show 

## 2021-08-30 NOTE — Patient Instructions (Signed)
-   Please get your shingles and COVID-19 booster vaccine at your pharmacy

## 2021-09-12 ENCOUNTER — Ambulatory Visit: Payer: Medicare HMO | Admitting: Adult Health

## 2021-09-27 ENCOUNTER — Encounter: Payer: Self-pay | Admitting: Adult Health

## 2021-09-27 ENCOUNTER — Ambulatory Visit (INDEPENDENT_AMBULATORY_CARE_PROVIDER_SITE_OTHER): Payer: Medicare HMO | Admitting: Adult Health

## 2021-09-27 VITALS — BP 150/88 | HR 84 | Temp 97.3°F | Resp 18 | Ht 66.0 in | Wt 267.0 lb

## 2021-09-27 DIAGNOSIS — I10 Essential (primary) hypertension: Secondary | ICD-10-CM | POA: Diagnosis not present

## 2021-09-27 DIAGNOSIS — C50912 Malignant neoplasm of unspecified site of left female breast: Secondary | ICD-10-CM

## 2021-09-27 DIAGNOSIS — E785 Hyperlipidemia, unspecified: Secondary | ICD-10-CM

## 2021-09-27 DIAGNOSIS — Z0000 Encounter for general adult medical examination without abnormal findings: Secondary | ICD-10-CM

## 2021-09-27 MED ORDER — HYDROCHLOROTHIAZIDE 25 MG PO TABS: 25.0000 mg | ORAL_TABLET | Freq: Every day | ORAL | 3 refills | Status: AC

## 2021-09-27 MED ORDER — AMLODIPINE BESYLATE 10 MG PO TABS: 10.0000 mg | ORAL_TABLET | Freq: Every day | ORAL | 3 refills | Status: AC

## 2021-09-27 MED ORDER — PRAVASTATIN SODIUM 40 MG PO TABS: 40.0000 mg | ORAL_TABLET | Freq: Every day | ORAL | 3 refills | Status: AC

## 2021-09-27 MED ORDER — METOPROLOL SUCCINATE ER 25 MG PO TB24: 25.0000 mg | ORAL_TABLET | Freq: Every day | ORAL | 3 refills | Status: AC

## 2021-09-27 NOTE — Patient Instructions (Signed)
Preventive Care 10 Years and Older, Female Preventive care refers to lifestyle choices and visits with your health care provider that can promote health and wellness. Preventive care visits are also called wellness exams. What can I expect for my preventive care visit? Counseling Your health care provider may ask you questions about your: Medical history, including: Past medical problems. Family medical history. Pregnancy and menstrual history. History of falls. Current health, including: Memory and ability to understand (cognition). Emotional well-being. Home life and relationship well-being. Sexual activity and sexual health. Lifestyle, including: Alcohol, nicotine or tobacco, and drug use. Access to firearms. Diet, exercise, and sleep habits. Work and work Statistician. Sunscreen use. Safety issues such as seatbelt and bike helmet use. Physical exam Your health care provider will check your: Height and weight. These may be used to calculate your BMI (body mass index). BMI is a measurement that tells if you are at a healthy weight. Waist circumference. This measures the distance around your waistline. This measurement also tells if you are at a healthy weight and may help predict your risk of certain diseases, such as type 2 diabetes and high blood pressure. Heart rate and blood pressure. Body temperature. Skin for abnormal spots. What immunizations do I need?  Vaccines are usually given at various ages, according to a schedule. Your health care provider will recommend vaccines for you based on your age, medical history, and lifestyle or other factors, such as travel or where you work. What tests do I need? Screening Your health care provider may recommend screening tests for certain conditions. This may include: Lipid and cholesterol levels. Hepatitis C test. Hepatitis B test. HIV (human immunodeficiency virus) test. STI (sexually transmitted infection) testing, if you are at  risk. Lung cancer screening. Colorectal cancer screening. Diabetes screening. This is done by checking your blood sugar (glucose) after you have not eaten for a while (fasting). Mammogram. Talk with your health care provider about how often you should have regular mammograms. BRCA-related cancer screening. This may be done if you have a family history of breast, ovarian, tubal, or peritoneal cancers. Bone density scan. This is done to screen for osteoporosis. Talk with your health care provider about your test results, treatment options, and if necessary, the need for more tests. Follow these instructions at home: Eating and drinking  Eat a diet that includes fresh fruits and vegetables, whole grains, lean protein, and low-fat dairy products. Limit your intake of foods with high amounts of sugar, saturated fats, and salt. Take vitamin and mineral supplements as recommended by your health care provider. Do not drink alcohol if your health care provider tells you not to drink. If you drink alcohol: Limit how much you have to 0-1 drink a day. Know how much alcohol is in your drink. In the [place] one drink equals one 12 oz bottle of beer (355 mL), one 5 oz glass of wine (148 mL), or one 1 oz glass of hard liquor (44 mL). Lifestyle Brush your teeth every morning and night with fluoride toothpaste. Floss one time each day. Exercise for at least 30 minutes 5 or more days each week. Do not use any products that contain nicotine or tobacco. These products include cigarettes, chewing tobacco, and vaping devices, such as e-cigarettes. If you need help quitting, ask your health care provider. Do not use drugs. If you are sexually active, practice safe sex. Use a condom or other form of protection in order to prevent STIs. Take aspirin only as told by  your health care provider. Make sure that you understand how much to take and what form to take. Work with your health care provider to find out whether it  is safe and beneficial for you to take aspirin daily. Ask your health care provider if you need to take a cholesterol-lowering medicine (statin). Find healthy ways to manage stress, such as: Meditation, yoga, or listening to music. Journaling. Talking to a trusted person. Spending time with friends and family. Minimize exposure to UV radiation to reduce your risk of skin cancer. Safety Always wear your seat belt while driving or riding in a vehicle. Do not drive: If you have been drinking alcohol. Do not ride with someone who has been drinking. When you are tired or distracted. While texting. If you have been using any mind-altering substances or drugs. Wear a helmet and other protective equipment during sports activities. If you have firearms in your house, make sure you follow all gun safety procedures. What's next? Visit your health care provider once a year for an annual wellness visit. Ask your health care provider how often you should have your eyes and teeth checked. Stay up to date on all vaccines. This information is not intended to replace advice given to you by your health care provider. Make sure you discuss any questions you have with your health care provider. Document Revised: 07/04/2020 Document Reviewed: 07/04/2020 Elsevier Patient Education  [place].
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
## 2021-09-30 ENCOUNTER — Other Ambulatory Visit: Payer: Medicare HMO

## 2021-10-03 ENCOUNTER — Ambulatory Visit: Payer: Medicare HMO | Admitting: Adult Health

## 2021-10-04 ENCOUNTER — Inpatient Hospital Stay: Payer: Medicare HMO | Attending: Hematology & Oncology

## 2021-10-04 ENCOUNTER — Inpatient Hospital Stay (HOSPITAL_BASED_OUTPATIENT_CLINIC_OR_DEPARTMENT_OTHER): Payer: Medicare HMO | Admitting: Hematology & Oncology

## 2021-10-04 ENCOUNTER — Other Ambulatory Visit: Payer: Self-pay

## 2021-10-04 ENCOUNTER — Encounter: Payer: Self-pay | Admitting: Hematology & Oncology

## 2021-10-04 VITALS — BP 150/63 | HR 74 | Temp 98.0°F | Resp 17 | Ht 66.0 in | Wt 259.0 lb

## 2021-10-04 DIAGNOSIS — C50919 Malignant neoplasm of unspecified site of unspecified female breast: Secondary | ICD-10-CM

## 2021-10-04 DIAGNOSIS — Z170 Estrogen receptor positive status [ER+]: Secondary | ICD-10-CM | POA: Diagnosis not present

## 2021-10-04 DIAGNOSIS — Z79811 Long term (current) use of aromatase inhibitors: Secondary | ICD-10-CM

## 2021-10-04 DIAGNOSIS — C50912 Malignant neoplasm of unspecified site of left female breast: Secondary | ICD-10-CM | POA: Diagnosis present

## 2021-10-04 LAB — CMP (CANCER CENTER ONLY)
ALT: 14 U/L (ref 0–44)
AST: 13 U/L — ABNORMAL LOW (ref 15–41)
Albumin: 4.5 g/dL (ref 3.5–5.0)
Alkaline Phosphatase: 67 U/L (ref 38–126)
Anion gap: 9 (ref 5–15)
BUN: 26 mg/dL — ABNORMAL HIGH (ref 8–23)
CO2: 30 mmol/L (ref 22–32)
Calcium: 11.2 mg/dL — ABNORMAL HIGH (ref 8.9–10.3)
Chloride: 98 mmol/L (ref 98–111)
Creatinine: 1.51 mg/dL — ABNORMAL HIGH (ref 0.44–1.00)
GFR, Estimated: 35 mL/min — ABNORMAL LOW (ref >60)
Glucose, Bld: 136 mg/dL — ABNORMAL HIGH (ref 70–99)
Potassium: 4.1 mmol/L (ref 3.5–5.1)
Sodium: 137 mmol/L (ref 135–145)
Total Bilirubin: 0.6 mg/dL (ref 0.3–1.2)
Total Protein: 8.6 g/dL — ABNORMAL HIGH (ref 6.5–8.1)

## 2021-10-04 LAB — CBC WITH DIFFERENTIAL (CANCER CENTER ONLY)
Abs Immature Granulocytes: 0.03 10*3/uL (ref 0.00–0.07)
Basophils Absolute: 0.1 10*3/uL (ref 0.0–0.1)
Basophils Relative: 1 %
Eosinophils Absolute: 0.1 10*3/uL (ref 0.0–0.5)
Eosinophils Relative: 1 %
HCT: 41.9 % (ref 36.0–46.0)
Hemoglobin: 13.5 g/dL (ref 12.0–15.0)
Immature Granulocytes: 0 %
Lymphocytes Relative: 28 %
Lymphs Abs: 2.8 10*3/uL (ref 0.7–4.0)
MCH: 29.6 pg (ref 26.0–34.0)
MCHC: 32.2 g/dL (ref 30.0–36.0)
MCV: 91.9 fL (ref 80.0–100.0)
Monocytes Absolute: 0.8 10*3/uL (ref 0.1–1.0)
Monocytes Relative: 8 %
Neutro Abs: 6.1 10*3/uL (ref 1.7–7.7)
Neutrophils Relative %: 62 %
Platelet Count: 322 10*3/uL (ref 150–400)
RBC: 4.56 MIL/uL (ref 3.87–5.11)
RDW: 13.7 % (ref 11.5–15.5)
WBC Count: 9.9 10*3/uL (ref 4.0–10.5)
nRBC: 0 % (ref 0.0–0.2)

## 2021-10-04 LAB — LACTATE DEHYDROGENASE: LDH: 182 U/L (ref 98–192)

## 2021-10-04 NOTE — Progress Notes (Signed)
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  HPI: Stephanie Rivas is a very charming 81 year old African-American female.  She has incredibly strong faith.  We had a good time sharing her faith and praying.  She apparently has a very interesting history.  She was found to have a suspicious lesion on the mammogram that was done back in January of this year.  This was in the left breast.  This measured 1.1 cm.  Patient subsequently had a biopsy of this in February.  The pathology showed that this was a invasive lobular carcinoma.  It was associated with lobular carcinoma in site to.  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  She denied any surgery.  She has been seen by surgical oncology.  She subsequently had a breast MRI in March.  Shockingly, this showed that the mass was a lot larger than what it had been on the mammogram/ultrasound.  The mass measured 4 x 7 x 3.4 cm.  There is no obvious axillary adenopathy.  In the right breast was noted a 4 mm mass in the upper outer quadrant.  Again, she has declined surgery.  She just feels that God will heal this.  She saw Oncology at [facility].  They went ahead and put her on Arimidex.  She did have a PET scan done.  This was done on 06/26/2021.  This was negative for any obvious metastatic disease.  She feels well.  She has had no complaints.  There is been no bony pain.  She has had no nausea or vomiting.  She says there is no history of breast cancer in the family.  She had her first child when she was 83 years old.  She does not smoke.  She does not drink.  Currently, I would have said that her performance status is probably ECOG 1.    Past Medical History:  Diagnosis Date   Hypertension   :  History reviewed. No pertinent surgical history.:   Current Outpatient Medications:    amLODipine  (NORVASC) 10 MG tablet, Take 1 tablet (10 mg total) by mouth daily., Disp: 30 tablet, Rfl: 3   anastrozole (ARIMIDEX) 1 MG tablet, Take 1 mg by mouth daily., Disp: , Rfl:    aspirin EC 81 MG tablet, Take 1 tablet by mouth daily., Disp: , Rfl:    hydrochlorothiazide (HYDRODIURIL) 25 MG tablet, Take 1 tablet (25 mg total) by mouth daily., Disp: 30 tablet, Rfl: 3   metoprolol succinate (TOPROL-XL) 25 MG 24 hr tablet, Take 1 tablet (25 mg total) by mouth daily., Disp: 30 tablet, Rfl: 3   ofloxacin (OCUFLOX) 0.3 % ophthalmic solution, Place 1 drop into the right eye 4 (four) times daily., Disp: , Rfl:    pravastatin (PRAVACHOL) 40 MG tablet, Take 1 tablet (40 mg total) by mouth daily., Disp: 30 tablet, Rfl: 3   prednisoLONE acetate (PRED FORTE) 1 % ophthalmic suspension, Place 1 drop into the right eye 4 (four) times daily., Disp: , Rfl: :  :  No Known Allergies:  History reviewed. No pertinent family history.:   Social History   Socioeconomic History   Marital status: Widowed    Spouse name: Not on file   Number of children: Not on file   Years of education: Not on file   Highest education level: Not on file  Occupational History   Not on file  Tobacco Use   Smoking status: Never   Smokeless tobacco: Never  Vaping Use   Vaping Use: Never used  Substance and Sexual Activity   Alcohol use: Never   Drug use: Never   Sexual activity: Not on file  Other Topics Concern   Not on file  Social History Narrative   Not on file   Social Determinants of Health   Financial Resource Strain: Not on file  Food Insecurity: Not on file  Transportation Needs: Not on file  Physical Activity: Not on file  Stress: Not on file  Social Connections: Not on file  Intimate Partner Violence: Not on file  :  Review of Systems  Constitutional: Negative.   HENT: Negative.    Eyes: Negative.   Respiratory: Negative.    Cardiovascular: Negative.   Gastrointestinal: Negative.   Genitourinary:  Negative.   Musculoskeletal: Negative.   Skin: Negative.   Neurological: Negative.   Endo/Heme/Allergies: Negative.   Psychiatric/Behavioral: Negative.       Exam: Vital signs show temperature of 98.  Pulse 74.  Blood pressure 150/63.  Weight is 259 pounds. @IPVITALS @ Physical Exam Vitals reviewed.  Constitutional:      Comments: Her breast exam shows right breast no masses, edema or erythema.  There is no right axillary adenopathy.  Her left breast may show a palpable mass at about the 3 o'clock position.  It is not tender.  There is no swelling of the left breast.  There is no nipple discharge.  There is no left axillary adenopathy.  HENT:     Head: Normocephalic and atraumatic.  Eyes:     Pupils: Pupils are equal, round, and reactive to light.  Cardiovascular:     Rate and Rhythm: Normal rate and regular rhythm.     Heart sounds: Normal heart sounds.  Pulmonary:     Effort: Pulmonary effort is normal.     Breath sounds: Normal breath sounds.  Abdominal:     General: Bowel sounds are normal.     Palpations: Abdomen is soft.  Musculoskeletal:        General: No tenderness or deformity. Normal range of motion.     Cervical back: Normal range of motion.  Lymphadenopathy:     Cervical: No cervical adenopathy.  Skin:    General: Skin is warm and dry.     Findings: No erythema or rash.  Neurological:     Mental Status: She is alert and oriented to person, place, and time.  Psychiatric:        Behavior: Behavior normal.        Thought Content: Thought content normal.        Judgment: Judgment normal.     Recent Labs    10/04/21 1122  WBC 9.9  HGB 13.5  HCT 41.9  PLT 322    Recent Labs    10/04/21 1122  NA 137  K 4.1  CL 98  CO2 30  GLUCOSE 136*  BUN 26*  CREATININE 1.51*  CALCIUM 11.2*    Blood smear review: None  Pathology: See above    Assessment and Plan: Stephanie Rivas is a very charming 67 year old African-American female.  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  She has a disconnect with respect to her mammogram and the MRI.  She has declined surgery.  She does not feel that surgery is in her benefit.  Again, she has a  strong faith.  She is on Arimidex.  I suppose that we could always add a CDK inhibitor or maybe even Faslodex to the treatment.  I think it be nice to see if there is any improvement just with the Arimidex.  She has been on this now for about 4 months.  It be worthwhile trying to get another MRI as this is clearly the best way to try to identify the size of this lesion.  I talked to her at length.  Again I told her that the best way to cure this is surgery.  Again, she just does not think that surgery is going to be helpful for her.  We will see about getting the MRI set up for her in about a month.  I will then plan to see her back.  If we do not see much change in the mass, then we will certainly add additional antiestrogen therapy.

## 2021-10-05 LAB — CANCER ANTIGEN 27.29: CA 27.29: 23.9 U/mL (ref 0.0–38.6)

## 2021-10-30 ENCOUNTER — Other Ambulatory Visit: Payer: Medicare HMO

## 2021-11-06 ENCOUNTER — Inpatient Hospital Stay: Payer: Medicare HMO | Admitting: Hematology & Oncology

## 2021-11-06 ENCOUNTER — Inpatient Hospital Stay: Payer: Medicare HMO | Attending: Hematology & Oncology

## 2021-11-07 ENCOUNTER — Other Ambulatory Visit: Payer: Medicare HMO

## 2021-11-26 ENCOUNTER — Other Ambulatory Visit: Payer: Medicare HMO
